# Patient Record
Sex: Female | Born: 2000 | Race: White | Hispanic: No | Marital: Single | Smoking: Never smoker
Health system: Southern US, Community
[De-identification: ages and names within clinical notes are randomized; demographics above are authoritative.]

## PROBLEM LIST (undated history)

## (undated) DIAGNOSIS — B269 Mumps without complication: Secondary | ICD-10-CM

## (undated) DIAGNOSIS — G039 Meningitis, unspecified: Secondary | ICD-10-CM

## (undated) DIAGNOSIS — R5600 Simple febrile convulsions: Secondary | ICD-10-CM

## (undated) DIAGNOSIS — R569 Unspecified convulsions: Secondary | ICD-10-CM

## (undated) DIAGNOSIS — B019 Varicella without complication: Secondary | ICD-10-CM

## (undated) DIAGNOSIS — B059 Measles without complication: Secondary | ICD-10-CM

## (undated) DIAGNOSIS — J189 Pneumonia, unspecified organism: Secondary | ICD-10-CM

## (undated) HISTORY — PX: TONSILLECTOMY: SUR1361

---
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
## 2018-07-16 ENCOUNTER — Emergency Department (HOSPITAL_COMMUNITY)
Admission: EM | Admit: 2018-07-16 | Discharge: 2018-07-16 | Disposition: A | Discharge status: Home / Self Care | Payer: BC Managed Care – PPO | Attending: Emergency Medicine | Admitting: Emergency Medicine

## 2018-07-16 ENCOUNTER — Encounter (HOSPITAL_COMMUNITY): Payer: Self-pay | Admitting: Emergency Medicine

## 2018-07-16 ENCOUNTER — Other Ambulatory Visit: Payer: Self-pay

## 2018-07-16 ENCOUNTER — Emergency Department (HOSPITAL_COMMUNITY): Payer: BC Managed Care – PPO

## 2018-07-16 DIAGNOSIS — Z20828 Contact with and (suspected) exposure to other viral communicable diseases: Secondary | ICD-10-CM | POA: Diagnosis not present

## 2018-07-16 DIAGNOSIS — R569 Unspecified convulsions: Secondary | ICD-10-CM | POA: Insufficient documentation

## 2018-07-16 DIAGNOSIS — J020 Streptococcal pharyngitis: Secondary | ICD-10-CM | POA: Diagnosis not present

## 2018-07-16 DIAGNOSIS — R000 Tachycardia, unspecified: Secondary | ICD-10-CM | POA: Insufficient documentation

## 2018-07-16 DIAGNOSIS — R509 Fever, unspecified: Secondary | ICD-10-CM | POA: Diagnosis present

## 2018-07-16 LAB — COMPREHENSIVE METABOLIC PANEL
ALT: 13 U/L (ref 0–44)
AST: 16 U/L (ref 15–41)
Albumin: 3.9 g/dL (ref 3.5–5.0)
Alkaline Phosphatase: 53 U/L (ref 38–126)
Anion gap: 11 (ref 5–15)
BUN: 7 mg/dL (ref 6–20)
CO2: 23 mmol/L (ref 22–32)
Calcium: 8.8 mg/dL — ABNORMAL LOW (ref 8.9–10.3)
Chloride: 104 mmol/L (ref 98–111)
Creatinine, Ser: 0.58 mg/dL (ref 0.44–1.00)
GFR calc Af Amer: >60 mL/min (ref >60)
GFR calc non Af Amer: >60 mL/min (ref >60)
Glucose, Bld: 110 mg/dL — ABNORMAL HIGH (ref 70–99)
Potassium: 3.6 mmol/L (ref 3.5–5.1)
Sodium: 138 mmol/L (ref 135–145)
Total Bilirubin: 0.9 mg/dL (ref 0.3–1.2)
Total Protein: 7.2 g/dL (ref 6.5–8.1)

## 2018-07-16 LAB — CBC WITH DIFFERENTIAL/PLATELET
Abs Immature Granulocytes: 0.14 10*3/uL — ABNORMAL HIGH (ref 0.00–0.07)
Basophils Absolute: 0.1 10*3/uL (ref 0.0–0.1)
Basophils Relative: 0 %
Eosinophils Absolute: 0 10*3/uL (ref 0.0–0.5)
Eosinophils Relative: 0 %
HCT: 40.1 % (ref 36.0–46.0)
Hemoglobin: 13.4 g/dL (ref 12.0–15.0)
Immature Granulocytes: 1 %
Lymphocytes Relative: 2 %
Lymphs Abs: 0.5 10*3/uL — ABNORMAL LOW (ref 0.7–4.0)
MCH: 32.1 pg (ref 26.0–34.0)
MCHC: 33.4 g/dL (ref 30.0–36.0)
MCV: 95.9 fL (ref 80.0–100.0)
Monocytes Absolute: 1 10*3/uL (ref 0.1–1.0)
Monocytes Relative: 5 %
Neutro Abs: 17.2 10*3/uL — ABNORMAL HIGH (ref 1.7–7.7)
Neutrophils Relative %: 92 %
Platelets: 184 10*3/uL (ref 150–400)
RBC: 4.18 MIL/uL (ref 3.87–5.11)
RDW: 11.8 % (ref 11.5–15.5)
WBC: 18.9 10*3/uL — ABNORMAL HIGH (ref 4.0–10.5)
nRBC: 0 % (ref 0.0–0.2)

## 2018-07-16 LAB — HCG, QUANTITATIVE, PREGNANCY: hCG, Beta Chain, Quant, S: <1 m[IU]/mL (ref <5)

## 2018-07-16 LAB — LACTIC ACID, PLASMA
Lactic Acid, Venous: 1 mmol/L (ref 0.5–1.9)
Lactic Acid, Venous: 0.8 mmol/L (ref 0.5–1.9)

## 2018-07-16 LAB — SARS CORONAVIRUS 2 BY RT PCR (HOSPITAL ORDER, PERFORMED IN ~~LOC~~ HOSPITAL LAB): SARS Coronavirus 2: NEGATIVE

## 2018-07-16 LAB — I-STAT BETA HCG BLOOD, ED (MC, WL, AP ONLY): I-stat hCG, quantitative: 23.7 m[IU]/mL — ABNORMAL HIGH (ref <5)

## 2018-07-16 LAB — GROUP A STREP BY PCR: Group A Strep by PCR: DETECTED — AB

## 2018-07-16 MED ORDER — KETOROLAC TROMETHAMINE 30 MG/ML IJ SOLN: 30.0000 mg | Freq: Once | INTRAMUSCULAR | Status: DC

## 2018-07-16 MED ORDER — DEXAMETHASONE SODIUM PHOSPHATE 10 MG/ML IJ SOLN
10.0000 mg | Freq: Once | INTRAMUSCULAR | Status: AC
  Administered 2018-07-16: 10 mg via INTRAVENOUS
  Filled 2018-07-16: qty 1

## 2018-07-16 MED ORDER — AMOXICILLIN-POT CLAVULANATE 875-125 MG PO TABS: 1.0000 | ORAL_TABLET | Freq: Two times a day (BID) | ORAL | 0 refills | Status: AC

## 2018-07-16 MED ORDER — SODIUM CHLORIDE 0.9 % IV BOLUS
1000.0000 mL | Freq: Once | INTRAVENOUS | Status: AC
  Administered 2018-07-16: 1000 mL via INTRAVENOUS

## 2018-07-16 MED ORDER — ACETAMINOPHEN 500 MG PO TABS
1000.0000 mg | ORAL_TABLET | Freq: Once | ORAL | Status: AC
  Administered 2018-07-16: 1000 mg via ORAL
  Filled 2018-07-16: qty 2

## 2018-07-16 MED ORDER — KETOROLAC TROMETHAMINE 30 MG/ML IJ SOLN
30.0000 mg | Freq: Once | INTRAMUSCULAR | Status: AC
  Administered 2018-07-16: 30 mg via INTRAVENOUS
  Filled 2018-07-16: qty 1

## 2018-07-16 MED ORDER — AMOXICILLIN-POT CLAVULANATE 875-125 MG PO TABS
1.0000 | ORAL_TABLET | Freq: Once | ORAL | Status: AC
  Administered 2018-07-16: 1 via ORAL
  Filled 2018-07-16: qty 1

## 2018-07-16 MED ORDER — DEXAMETHASONE SODIUM PHOSPHATE 10 MG/ML IJ SOLN: 10.0000 mg | Freq: Once | INTRAMUSCULAR | Status: DC

## 2018-07-16 NOTE — ED Triage Notes (Signed)
Pt c/o of sore throat and fever x 3 days.  Took 800 mg of ibuprofen this morning

## 2018-07-16 NOTE — Discharge Instructions (Signed)
Please read and follow all provided instructions.  Your diagnoses today include:  1. Strep pharyngitis   2. Tachycardia     Tests performed today include: Strep test: was POSITIVE for strep throat Vital signs. See below for your results today.   Please take all of your antibiotics until finished.   You may develop abdominal discomfort or nausea from the antibiotic. If this occurs, you may take it with food. Some patients also get diarrhea with antibiotics. You may help offset this with probiotics which you can buy or get in yogurt. Do not eat or take the probiotics until 2 hours after your antibiotic. Some women develop vaginal yeast infections after antibiotics. If you develop unusual vaginal discharge after being on this medication, please see your primary care provider.   Some people develop allergies to antibiotics. Symptoms of antibiotic allergy can be mild and include a flat rash and itching. They can also be more serious and include:  ?Hives - Hives are raised, red patches of skin that are usually very itchy.  ?Lip or tongue swelling  ?Trouble swallowing or breathing  ?Blistering of the skin or mouth.  If you have any of these serious symptoms, please seek emergency medical care immediately.  You are prescribed ibuprofen, a non-steroidal anti-inflammatory agent (NSAID) for pain. You may take 600 mg every 6 hours as needed for pain. If still requiring this medication around the clock for acute pain after 10 days, please see your primary healthcare provider.  Women who are pregnant, breastfeeding, or planning on becoming pregnant should not take non-steroidal anti-inflammatories such as Advil and Aleve. Tylenol is a safe over the counter pain reliever in pregnant women.  You may combine this medication with Tylenol, 650 mg every 6 hours, so you are receiving something for pain every 3 hours.  This is not a long-term medication unless under the care and direction of your primary  provider. Taking this medication long-term and not under the supervision of a healthcare provider could increase the risk of stomach ulcers, kidney problems, and cardiovascular problems such as high blood pressure.    Take any medications prescribed only as directed.   Home care instructions:  Please read the educational materials provided and follow any instructions contained in this packet.  You may perform saltwater gargles.   Follow-up instructions: Please follow-up with your primary care provider as needed for further evaluation of your symptoms.  Return instructions:  Please return to the Emergency Department if you experience worsening symptoms.  Return if you have worsening problems swallowing, your neck becomes swollen, you cannot swallow your saliva or your voice becomes muffled.  Return with high persistent fever, persistent vomiting, or if you have trouble breathing.  Please return if you have any other emergent concerns.  Additional Information:  Your vital signs today were: BP 104/66    Pulse (!) 103    Temp (!) 102.4 F (39.1 C) (Oral)    Resp 17    Ht 5\' 9"  (1.753 m)    Wt 65.8 kg    LMP 07/16/2018    SpO2 97%    BMI 21.41 kg/m  If your blood pressure (BP) was elevated above 135/85 this visit, please have this repeated by your doctor within one month.

## 2018-07-16 NOTE — ED Provider Notes (Signed)
[facility] Provider [facility]   CSN: [id number] Arrival date & time: 07/16/18  1044     History   Chief Complaint Chief Complaint  Patient presents with  . Fever  . Sore Throat    HPI April Glenn is a 18 y.o. female.     HPI  Patient is a 18 year old female with past medical history of recurrent ear infections, seizures, strep pharyngitis after tonsillectomy presenting for sore throat, fever, and left earache.  Patient reports that all of the symptoms began over the past 24 hours.  [place] going to [place].  Patient reports that it hurts to swallow but she does not have any obstructive swallowing or difficulty breathing.  She denies cough or shortness of breath.  Patient reports that she has had some increased pain and muffled hearing out of the left ear but no drainage.  She denies any chest pain, abdominal pain, nausea, vomiting, dysuria, urgency, frequency, or flank pain.  She took ibuprofen this morning for her symptoms without full relief.  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  Past Medical History:  Diagnosis Date  . Chicken pox   . Febrile seizures (HCC)   . Measles   . Meningitis spinal   . Mumps   . PNA (pneumonia)   . Seizures (HCC)     There are no active problems to display for this patient.   Past Surgical History:  Procedure Laterality Date  . TONSILLECTOMY       OB History   No obstetric history on file.      Home Medications    Prior to Admission medications   Medication Sig Start Date End Date Taking? Authorizing Provider  ibuprofen (ADVIL) 200 MG tablet Take 800 mg by mouth every 6 (six) hours as needed for fever or moderate pain.   Yes [provider]    Family History No family history on file.  Social History Social History   Tobacco Use  . Smoking status:  Never Smoker  . Smokeless tobacco: Never Used  Substance Use Topics  . Alcohol use: Yes    Comment: occ  . Drug use: No     Allergies   Patient has no known allergies.   Review of Systems Review of Systems  Constitutional: Positive for chills and fever.  HENT: Positive for sore throat. Negative for congestion, rhinorrhea, sinus pain, trouble swallowing and voice change.   Eyes: Negative for visual disturbance.  Respiratory: Negative for cough and shortness of breath.   Cardiovascular: Negative for chest pain.  Gastrointestinal: Negative for abdominal pain, nausea and vomiting.  Genitourinary: Negative for dysuria and flank pain.  Musculoskeletal: Negative for back pain and myalgias.  Skin: Negative for rash.  Neurological: Negative for dizziness, syncope, light-headedness and headaches.     Physical Exam Updated Vital Signs BP 104/66   Pulse (!) 103   Temp (!) 102.4 F (39.1 C) (Oral)   Resp 17   Ht 5\' 9"  (1.753 m)   Wt 65.8 kg   LMP 07/16/2018   SpO2 97%   BMI 21.41 kg/m   Physical Exam Vitals signs and nursing note reviewed.  Constitutional:      General: She is not in acute distress.    Appearance: She is well-developed.  HENT:     Head: Normocephalic and atraumatic.  Right Ear: Tympanic membrane normal.     Ears:     Comments: Left tympanic membrane with extensive scarring.  Precludes full visualization of behind the TM, but appears to have effusion.  No tenderness of mastoid or with palpation of the auricle, pinna, or tragus.    Mouth/Throat:     Mouth: Mucous membranes are moist.     Pharynx: Uvula midline. Oropharyngeal exudate and posterior oropharyngeal erythema present. No uvula swelling.     Tonsils: 0 on the right. 0 on the left.     Comments: Tonsils surgically absent.  No asymmetric swelling of the posterior pharynx.  No edema of posterior pharynx.  Eyes:     Conjunctiva/sclera: Conjunctivae normal.     Pupils: Pupils are equal, round, and  reactive to light.  Neck:     Musculoskeletal: Normal range of motion and neck supple.  Cardiovascular:     Rate and Rhythm: Regular rhythm. Tachycardia present.     Heart sounds: S1 normal and S2 normal. No murmur.  Pulmonary:     Effort: Pulmonary effort is normal.     Breath sounds: Normal breath sounds. No wheezing or rales.  Abdominal:     General: There is no distension.     Palpations: Abdomen is soft.     Tenderness: There is no abdominal tenderness. There is no guarding.  Musculoskeletal: Normal range of motion.        General: No deformity.  Lymphadenopathy:     Cervical: No cervical adenopathy.  Skin:    General: Skin is warm and dry.     Findings: No erythema or rash.  Neurological:     Mental Status: She is alert.     Comments: Cranial nerves grossly intact. Patient moves extremities symmetrically and with good coordination.  Psychiatric:        Behavior: Behavior normal.        Thought Content: Thought content normal.        Judgment: Judgment normal.      ED Treatments / Results  Labs (all labs ordered are listed, but only abnormal results are displayed) Labs Reviewed  GROUP A STREP BY PCR - Abnormal; Notable for the following components:      Result Value   Group A Strep by PCR DETECTED (*)    All other components within normal limits  CBC WITH DIFFERENTIAL/PLATELET - Abnormal; Notable for the following components:   WBC 18.9 (*)    Neutro Abs 17.2 (*)    Lymphs Abs 0.5 (*)    Abs Immature Granulocytes 0.14 (*)    All other components within normal limits  COMPREHENSIVE METABOLIC PANEL - Abnormal; Notable for the following components:   Glucose, Bld 110 (*)    Calcium 8.8 (*)    All other components within normal limits  I-STAT BETA HCG BLOOD, ED ([facility], [facility], [facility] ONLY) - Abnormal; Notable for the following components:   I-stat hCG, quantitative 23.7 (*)    All other components within normal limits  SARS CORONAVIRUS 2 (HOSPITAL ORDER, PERFORMED IN [facility])  LACTIC ACID, PLASMA  LACTIC ACID, PLASMA  HCG, QUANTITATIVE, PREGNANCY    EKG    Radiology Dg Chest Portable 1 View  Result Date: 07/16/2018 CLINICAL DATA:  18 year old female with sore throat and fever for 3 days. Cough. COVID-19 test pending. EXAM: PORTABLE CHEST 1 VIEW COMPARISON:  10/18/2014 FINDINGS: Portable AP upright view at 1226 hours. Normal lung volumes and mediastinal contours. Visualized tracheal air column  is within normal limits. Allowing for portable technique the lungs are clear. No pneumothorax or pleural effusion. No osseous abnormality identified. IMPRESSION: Negative portable chest. Electronically Signed   By: Odessa FlemingH  Hall M.D.   On: 07/16/2018 12:56    Procedures Procedures (including critical care time)  Medications Ordered in ED Medications  amoxicillin-clavulanate (AUGMENTIN) 875-125 MG per tablet 1 tablet (has no administration in time range)  acetaminophen (TYLENOL) tablet 1,000 mg (1,000 mg Oral Given 07/16/18 1140)  sodium chloride 0.9 % bolus 1,000 mL (0 mLs Intravenous Stopped 07/16/18 1450)     Initial Impression / Assessment and Plan / ED Course  I have reviewed the triage vital signs and the nursing notes.  Pertinent labs & imaging results that were available during my care of the patient were reviewed by me and considered in my medical decision making (see chart for details).  Clinical Course as of Jul 16 1631  Wed Jul 16, 2018  1319 WBC(!): 18.9 [AM]  1319 Lactic Acid, Venous: 1.0 [AM]  1433 Group A Strep by PCR(!): DETECTED [AM]  1433 SARS Coronavirus 2: NEGATIVE [AM]  1516 Significantly improved.   Pulse Rate(!): 106 [AM]  1557 Pt reports that there is no change   I-stat hCG, quantitative(!): 23.7 [AM]  1628 Istat hcg false positive.   HCG, Beta Chain, Quant, S: <1 [AM]    Clinical Course User Index [AM] Aviva KluverMurray, Kinzy Weyers B, PA-C       This is a previously healthy 18 year old female with past medical history of  recurrent ear infections and strep pharyngitis presenting for sore throat, left otalgia, fever and chills.  On arrival she was initially tachycardic to 138 with a fever of 102.4.  She remained nontoxic-appearing, and had a patent airway without evidence of RPA, PTA, or other deep space infection of the head or neck.  Work-up demonstrating leukocytosis of 18.9.  CMP unremarkable.  SARS-CoV-2 test negative.  No lactic acidosis.  Group A strep was detected.  Chest x-ray without cardiopulmonary abnormality.  Incidentally, patient had a elevated hCG rapid test.  This was followed with a formal quantitative test which was negative.  Patient was administered Decadron and Toradol.  She will be treated with Augmentin for strep pharyngitis and ear infection.  Patient was instructed to follow-up with her primary care provider for recheck.  On review of records, every time patient is evaluated she has tachycardia.  Her tachycardia significantly improved with fluids and antipyretics.  She was encouraged to continue to use antipyretics.  She was also encouraged to follow-up with primary care provider for further work-up if she continues to have tachycardia at rest.  Given the chronicity of this, low suspicion for sepsis, DVT/PE, or other cause of tachycardia.  Did consider hyperthyroidism, however patient does not appear to be thyrotoxic today.  Return precautions given for any worsening sore throat, difficulty breathing or difficulty swallowing, neck induration or submandibular tenderness.  Patient is in understanding and agrees with plan of care.  This is a shared visit with Dr. Darrick GrinderJoshua Zavit. Patient was independently evaluated by this attending physician. Attending physician consulted in evaluation and discharge management.  Final Clinical Impressions(s) / ED Diagnoses   Final diagnoses:  Strep pharyngitis  Tachycardia    ED Discharge Orders         Ordered    amoxicillin-clavulanate (AUGMENTIN) 875-125 MG  tablet  Every 12 hours     07/16/18 1646           [place]ceolaMurray, Saginaw[place]lyssa B,  PA-C 07/16/18 1646    Elnora Morrison, MD 07/17/18 [phone]

## 2018-07-16 NOTE — ED Notes (Signed)
Pt given sprite 

## 2019-02-11 ENCOUNTER — Ambulatory Visit: Admit: 2019-02-11 | Discharge: 2019-02-11 | Payer: PRIVATE HEALTH INSURANCE | Attending: Internal Medicine

## 2019-02-11 DIAGNOSIS — R000 Tachycardia, unspecified: Secondary | ICD-10-CM

## 2019-02-11 NOTE — Progress Notes (Signed)
[place]   Electrophysiology Consultation   Date: 02/11/2019  Reason for Consultation: Syncope/tachycardia  Consult Requesting Physician: No primary care provider on file.      Chief Complaint   Patient presents with   ??? New Patient   ??? Tachycardia        HPI: [name] is a 19 y.o. presents to the office as a new patient.  She has been having episodes of chest pain that can happen without any particular triggers.  She has noted these pains to be associated with increased heart rate.  She was having fainting spells the summer of 2019 that have resolved.  She correlated her chest pain and syncopal episodes with stressors. She records her episodes with her Apple Watch. These episodes have symptoms of chest pain and presyncope. She does occasionally feel her heart racing. 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  More recently she stood up from her chair at work and felt lightheaded with some chest discomfort and tachycardia.    History reviewed. No pertinent past medical history.     History reviewed. No pertinent surgical history.    Allergies:  No Known Allergies    Social History:   reports that she has been smoking. She has never used smokeless tobacco. She reports current alcohol use.     Family History:     Reviewed. Denies family history of sudden cardiac death, arrhythmia, premature CAD    Review of System:  All other systems reviewed and are negative except for that noted above. Pertinent negatives are:   General: negative for fever, chills   Ophthalmic ROS: negative for - eye pain or loss of vision  ENT ROS: negative for - headaches, sore throat   Respiratory: negative for - cough, sputum  Cardiovascular: Reviewed in HPI  Gastrointestinal: negative for - abdominal pain, diarrhea, N/V  Hematology: negative for - bleeding, blood clots, bruising or jaundice  Genito-Urinary:  negative for - Dysuria or incontinence  Musculoskeletal: negative  for - Joint swelling, muscle pain  Neurological: negative for - confusion, dizziness, headaches   Psychiatric: No anxiety, no depression.  Dermatological: negative for - rash    Physical Examination:  Vitals:    02/11/19 1610   BP: 126/83   Pulse: 108   SpO2: 98%      Wt Readings from Last 3 Encounters:   02/11/19 155 lb 6.4 oz (70.5 kg)       ?? Constitutional: Oriented. No distress.   ?? Head: Normocephalic and atraumatic.   ?? Mouth/Throat: Oropharynx is clear and moist.   ?? Eyes: Conjunctivae normal. EOM are normal.   ?? Neck: Neck supple. No rigidity. No JVD present.    ?? Cardiovascular: Normal rate, regular rhythm, S1&S2.  SM  ?? Pulmonary/Chest: Bilateral respiratory sounds. No wheezes, No rhonchi.    ?? Abdominal: Soft. Bowel sounds present. No distension, No tenderness.   ?? Musculoskeletal: No tenderness. No edema    ?? Lymphadenopathy: Has no cervical adenopathy.   ?? Neurological: Alert and oriented. Cranial nerve appears intact, No Gross deficit   ?? Skin: Skin is warm and dry. No rash noted.   ?? Psychiatric: Has a normal behavior       Labs, diagnostic and imaging results reviewed.   Reviewed.   Lab Results   Component Value Date    CREATININE 0.6 12/21/2008    AST 24 12/21/2008    ALT 19  12/21/2008       ECG:  02/11/19  Sinus tachycardia 103    Echo: none  Cath: none    Medication:  No current outpatient medications on file.     No current facility-administered medications for this visit.        Assessment and plan:       Chest pain/Tachycardia    -ECG today shows sinus tachycardia  -Monitors rates with her apple watch.  Prior to the [employer] she had noted her heart rate to be elevated at times and that is why she got the watch.  Now she notices that her heart rate may shot up to 140s without any triggers.  -Will order a 7 day holter to more objectively assess her heart rate profile.  We will also be able to correlate her symptoms to her rhythm.  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  I doubt that the chest pain is any significant cardiac disease given the lack of any family history of CAD and her young age.    -A systolic murmur noted upon ascultation, will do an echo to r/o anatomical abnormality    I will start her on a beta-blocker after the monitor is done to help with the pain and also tachycardia.  Hydration was recommended.    Syncope and collapse  -Rarely has episodes of chest pain and tachycardia in conjunction with this  She has not had any recent episodes of syncope.  They probably were related to a vagal event.  -Labs ordered   -BMP, CBC, TSH  -Encouraged hydration and being careful when changing positions    Plan  7 day holter  Echo  Blood work  1 month with EPNP  Start metoprolol 25 mg BID after monitor     Thank you for allowing me to participate in the care of Lashone F Dimario.  Further evaluation will be based upon the patient's clinical course and testing results.     All questions and concerns were addressed to the patient/family. Alternatives to my treatment were discussed. I have discussed the above stated plan and the patient verbalized understanding and agreed with the plan.    NOTE: This report was transcribed using voice recognition software. Every effort was made to ensure accuracy, however, inadvertent computerized transcription errors may be present.       Birdie Hopes, MD, [facility]  [facility]   Office: [phone]     Scribe attestation: This note was scribed in the presence of Birdie Hopes, MD by Levora Angel, RN    I, Dr. Birdie Hopes personally performed the services described in this documentation as scribed by RN in my presence, and it is both accurate and complete. ??

## 2019-02-18 ENCOUNTER — Ambulatory Visit: Payer: PRIVATE HEALTH INSURANCE

## 2019-02-19 ENCOUNTER — Inpatient Hospital Stay: Admit: 2019-02-19 | Payer: PRIVATE HEALTH INSURANCE

## 2019-02-19 DIAGNOSIS — R079 Chest pain, unspecified: Secondary | ICD-10-CM

## 2019-02-19 LAB — ECHOCARDIOGRAM COMPLETE 2D W DOPPLER W COLOR: Left Ventricular Ejection Fraction: 68

## 2019-02-20 NOTE — Telephone Encounter (Signed)
Called pt about the message below and she verbalized understanding.

## 2019-02-20 NOTE — Telephone Encounter (Signed)
Echo results unremarkable. EF normal.    Colonel Bald, APRN-CNP

## 2019-02-20 NOTE — Telephone Encounter (Signed)
Pt calling for her echo results pls call to advise thank you

## 2019-03-17 ENCOUNTER — Ambulatory Visit: Admit: 2019-03-17 | Discharge: 2019-03-17 | Payer: PRIVATE HEALTH INSURANCE | Attending: Family

## 2019-03-17 DIAGNOSIS — R000 Tachycardia, unspecified: Secondary | ICD-10-CM

## 2019-03-17 MED ORDER — PROPRANOLOL HCL ER 60 MG PO CP24
60 MG | ORAL_CAPSULE | Freq: Every day | ORAL | 1 refills | Status: DC
Start: 2019-03-17 — End: 2019-03-17

## 2019-03-17 MED ORDER — PROPRANOLOL HCL ER 60 MG PO CP24
60 MG | ORAL_CAPSULE | Freq: Every day | ORAL | 1 refills | Status: DC
Start: 2019-03-17 — End: 2019-05-13

## 2019-03-17 NOTE — Progress Notes (Signed)
[facility]   Electrophysiology  Bettye Boeck, APRN-CNP  Attending EP: Dr. Jorene Minors  Date: 03/17/2019  I had the privilege of visiting Morgan Price in the office.     Chief Complaint:   Chief Complaint   Patient presents with   ??? Tachycardia     History of Present Illness: History obtained from patient and medical record.    Morgan Price is 19 y.o. female with no significant PMHx who presented to office as new patient 02/11/2019 with complaints of chest pain and elevated HR. She also has hx of syncope and pre-syncope.     Interval history: Today, Morgan Price is being seen for syncope and tachycardia.  She attempted to wear a monitor, however she had a bad skin reaction and she had to remove it after the 3rd day.  She did have symptoms while being recorded. Denies any syncopal ore pre-syncopal episodes.  She has seen a therapist in the past who felt her tachycardia and dizziness could be cause by these anxiety attacks.  Denies any recurrent episodes of syncope or pe-syncope, however she continues to experience chest pressure and palpitations with tachycardia up to low 100's on her apple watch.  She has not been exercising due to her symptoms. Echo was normal.     Denies having chest pain, palpitations, shortness of breath or dizziness at the time of this visit.    With regard to medication therapy the patient has been compliant with prescribed regimen. She has tolerated therapy to date.     Assessment:  Tachycardia   -  ECG shows SR 75 today   - Reports episodes of symptomatic tachycardia daily with chest pressure, palpitaions and SOB   - She has stopped working out due to symptoms   - Could be at least worsened by [employer]   - She wore monitor for 3 days due to skin reaction that is now resolved.  She brings monitor in today, no results are avail;able for review  Anxiety   - Could be source of tachycardia and symptoms   - She has worked with therapist in the past who felt her symptoms could be  related to anxiety attacks   - Can try propranolol that will reduce HR and can possibly treat anxiety   - Also follow with PCP, which was discussed  Syncope   - Rarely has episodes of syncope with tachycardia/CP   - Likely vasovagal given presentation   - No recurrence since 2019  Plan  - Start propranolol daily   - Can switch to as needed in the future  - Call office if symptoms nor improved  - Will get monitor results and follow up if needed    F/U: Follow-up with EP in 2-3 months NPAL  -Follow up with device clinic as scheduled  -Call [facility] at [phone] with any questions    Allergies:  No Known Allergies  Home Medications:  Prior to Visit Medications    Not on File      Past Medical History:  No past medical history on file.  Past Surgical History:    has no past surgical history on file.   Social History:  Reviewed.  reports that she has been smoking. She has never used smokeless tobacco. She reports current alcohol use.   Family History:  Reviewed. family history is not on file.   Denies family history of sudden cardiac death, arrhythmia, premature CAD    Review of System:  ??  Constitutional: No weight changes or weakness  ?? HEENT: No visual changes. No mouth sores or sore throat.  ?? Cardiovascular: admits to chest pain, admits to dyspnea on exertion, admits to palpitations or denies loss of consciousness. No cough, hemoptysis, denies pleuritic pain, or phlebitis.admits to dizziness.   ?? Respiratory: denies cough or wheezing.   ?? Gastrointestinal: Negative, No blood in stools.   ?? Genitourinary: No hematuria.  ?? Neurological: No focal weakness  ?? Psychiatric: No confusion, anxiety, or depression.  ?? Hem/Lymph: Denies abnormal bruising or bleeding.    Physical Examination:  There were no vitals filed for this visit.   Wt Readings from Last 3 Encounters:   02/11/19 155 lb 6.4 oz (70.5 kg)     ??? Constitutional: Cooperative and in no apparent distress, and appears well nourished  ??? Skin: Warm and  pink; no cyanosis or bruising  ??? HEENT: Symmetric and normocephalic. Conjunctiva pink with clear sclera. Mucus membranes pink and moist. No visible masses/goiter  ??? Respiratory: Respirations symmetric and unlabored. Lungs clear to auscultation bilaterally, no wheezing, rhonchi, or crackles.  ??? Cardiovascular:  regular rate and rhythm. S1 & S2 present, negative for murmur. negative elevation of JVP. No peripheral edema.    ??? Musculoskeletal:  No focal weakness.  ??? Neurological/Psych: Awake and orientated to person, place and time. Calm affect, appropriate mood.     Pertinent labs, diagnostic, device, and imaging results reviewed as a part of this visit    LABS    CBC:   Lab Results   Component Value Date    WBC 10.9 12/21/2008    HGB 15.5 12/21/2008    HCT 46.0 (H) 12/21/2008    MCV 91.8 12/21/2008    PLT 252 12/21/2008     BMP:   Lab Results   Component Value Date    CREATININE 0.6 12/21/2008    BUN 12 12/21/2008    NA 139 12/21/2008    K 3.8 12/21/2008    CL 103 12/21/2008    CO2 27 12/21/2008     CrCl cannot be calculated (Patient's most recent lab result is older than the maximum 120 days allowed.).   No results found for: BNP    Thyroid: No results found for: TSH, T3TOTAL, T4TOTAL, THYROIDAB  Lipid Panel: No results found for: CHOL, HDL, TRIG  LFTs:  Lab Results   Component Value Date    ALT 19 12/21/2008    AST 24 12/21/2008    ALKPHOS 220 12/21/2008    BILITOT 0.50 12/21/2008     Coags: No results found for: PROTIME, INR, APTT  ECG: 03/17/2019 SR HR 75, PR 118, QRS 82, QTc 402    Echo: 02/19/2019   Summary   -Left ventricular cavity size is normal.   -Overall left ventricular systolic function appears normal.   -Ejection fraction is visually estimated to be 65-70%.   -No regional wall motion abnormalities are noted.   -Normal diastolic function. E/e' = 5.0.   -Aortic valve leaflets are not well seen, but appear tri-leaflet.   -Trivial mitral regurgitation.   -Trivial tricuspid regurgitation.   -IVC size is  normal (<2.1 cm) but collapses < 50% with respiration   consistent with elevated RA pressure (8 mmHg).    Diet & Exercise:  ??? The patient is counseled to follow a low salt diet to assure blood pressure remains controlled for cardiovascular risk factor modification  ??? The patient is counseled to avoid excess caffeine, and energy drinks as this may  exacerbated ectopy and arrhythmia  ??? The patient is counseled to lose weight to control cardiovascular risk factors  ??? Exercise program discussed: To improve overall cardiovascular health, the patient is instructed to increase cardiovascular related activities with a goal of 150 min/week of moderate level activity or 10,000 steps per day. Encouraged to perform as much activity as tolerated     I have addressed the patient's cardiac risk factors and adjusted pharmacologic treatment as needed. In addition, I have reinforced the need for patient directed risk factor modification.    I independently reviewed the holter and ECG    All questions and concerns were addressed with the patient. Alternatives to treatment were discussed.     Thank you for allowing to [place] to participate in the care of Morgan Price.    Bettye Boeck, APRN-CNP  [facility]   Office: [phone]

## 2019-03-17 NOTE — Patient Instructions (Signed)
-   Start propranolol 30 mg daily and can increase to 60 mg daily as needed for fast heart beat and palpitations  - Call office if your symptoms do not improve  - Will call with results of the monitor if they are abnormal  - Follow up in 2-3 months

## 2019-03-24 NOTE — Addendum Note (Signed)
Addended by: Onnie Graham on: 03/24/2019 08:52 AM     Modules accepted: Orders

## 2019-05-13 ENCOUNTER — Ambulatory Visit: Admit: 2019-05-13 | Discharge: 2019-05-13 | Payer: PRIVATE HEALTH INSURANCE | Attending: Family

## 2019-05-13 DIAGNOSIS — R000 Tachycardia, unspecified: Secondary | ICD-10-CM

## 2019-05-13 MED ORDER — PROPRANOLOL HCL 20 MG PO TABS
20 MG | ORAL_TABLET | Freq: Every day | ORAL | 1 refills | Status: DC | PRN
Start: 2019-05-13 — End: 2019-09-02

## 2019-05-13 MED ORDER — PROPRANOLOL HCL ER 60 MG PO CP24
60 MG | ORAL_CAPSULE | Freq: Every day | ORAL | 3 refills | Status: DC
Start: 2019-05-13 — End: 2019-09-02

## 2019-05-13 NOTE — Progress Notes (Signed)
[facility]   Electrophysiology  Dominic Pea, APRN-CNP  Attending EP: Dr. Patience Musca  Date: 05/13/2019  I had the privilege of visiting Morgan Price in the office.     Chief Complaint:   Chief Complaint   Patient presents with   ??? Follow-up     2 month     History of Present Illness: History obtained from patient and medical record.    GLORIS Price is 19 y.o. female with no significant PMHx who presented to office as new patient 02/11/2019 with complaints of chest pain and elevated HR. She also has hx of syncope and pre-syncope.     Interval history: Today, SHUNTELL FOODY is being seen for follow up.  Holter monitor results were not available at last visit due to patient bringing Holter in with her to appt.  Holter 1/20-1/25/2021 showed symptoms with SR, no arrhythmia.  She was started on propranolol at her last visit due to palpitations and anxiety. Reports that she has had signifiant improvement in her symptoms.  Symptoms have gone from a few times daily to a few times monthly  She is asking for additional as needed rx of propranolol.  At times she has taken additional 60 mg tablet with relief of breakthrough symptoms.  Denies SOB, swelling, or syncope.    Denies having chest pain, palpitations, shortness of breath, orthopnea/PND, cough, or dizziness at the time of this visit.    With regard to medication therapy the patient has been compliant with prescribed regimen. She has tolerated therapy to date.       Assessment:  Tachycardia/Palpitations   - ECG shows SR   - Palpitations have improved significantly with propranolol   - Could be exaggerated by anxiety    - She wore Holter monitor for 5 days and removed early due to skin reaction;  97 symptoms and no arrythmia noted  Anxiety   - She has noticed her symptoms worsen during episodes of anxiety   - On propranolol with good relief  Syncope   - No arrhythmia on Holter   - Likely vasovagal given presentation   - No recurrence since 2019  Plan  -  Continue propranolol daily  - Add prn dose for breakthrough   - Establish with PCP  - Call office if symptoms worsen  - Increase activity     F/U: Follow-up with [facility] and as needed  -Follow up with device clinic as scheduled  -Call [facility] at [phone] with any questions    Allergies:  No Known Allergies  Home Medications:  Prior to Visit Medications    Medication Sig Taking? Authorizing Provider   propranolol (INDERAL LA) 60 MG extended release capsule Take 1 capsule by mouth daily  Dominic Pea, APRN - CNP      Past Medical History:  No past medical history on file.  Past Surgical History:    has no past surgical history on file.   Social History:  Reviewed.  reports that she has been smoking. She has never used smokeless tobacco. She reports current alcohol use.   Family History:  Reviewed. family history is not on file.   Denies family history of sudden cardiac death, arrhythmia, premature CAD    Review of System:  ?? Constitutional: No weight changes or weakness  ?? HEENT: No visual changes. No mouth sores or sore throat.  ?? Cardiovascular: denies exertional chest pain, denies dyspnea on exertion, admits to palpitations or denies loss of consciousness.  No cough, hemoptysis, denies pleuritic pain, or phlebitis.denies dizziness.   ?? Respiratory: denies cough or wheezing.   ?? Gastrointestinal: Negative, No blood in stools.   ?? Genitourinary: No hematuria.  ?? Neurological: No focal weakness  ?? Psychiatric: No confusion, anxiety, or depression.  ?? Hem/Lymph: Denies abnormal bruising or bleeding.    Physical Examination:  There were no vitals filed for this visit.   Wt Readings from Last 3 Encounters:   03/17/19 156 lb 12.8 oz (71.1 kg)   02/11/19 155 lb 6.4 oz (70.5 kg)     ??? Constitutional: Cooperative and in no apparent distress, and appears well nourished  ??? Skin: Warm and pink; no cyanosis or bruising  ??? HEENT: Symmetric and normocephalic. Conjunctiva pink with clear sclera. Mucus membranes  pink and moist. No visible masses/goiter  ??? Respiratory: Respirations symmetric and unlabored. Lungs clear to auscultation bilaterally, no wheezing, rhonchi, or crackles.  ??? Cardiovascular:  regular rate and rhythm. S1 & S2 present, negative for murmur. negative elevation of JVP. No peripheral edema.    ??? Musculoskeletal:  No focal weakness.  ??? Neurological/Psych: Awake and orientated to person, place and time. Calm affect, appropriate mood.     Pertinent labs, diagnostic, device, and imaging results reviewed as a part of this visit     LABS    CBC:   Lab Results   Component Value Date    WBC 10.9 12/21/2008    HGB 15.5 12/21/2008    HCT 46.0 (H) 12/21/2008    MCV 91.8 12/21/2008    PLT 252 12/21/2008     BMP:   Lab Results   Component Value Date    CREATININE 0.6 12/21/2008    BUN 12 12/21/2008    NA 139 12/21/2008    K 3.8 12/21/2008    CL 103 12/21/2008    CO2 27 12/21/2008     CrCl cannot be calculated (Patient's most recent lab result is older than the maximum 120 days allowed.).   No results found for: BNP    Thyroid: No results found for: TSH, T3TOTAL, T4TOTAL, THYROIDAB  Lipid Panel: No results found for: CHOL, HDL, TRIG  LFTs:  Lab Results   Component Value Date    ALT 19 12/21/2008    AST 24 12/21/2008    ALKPHOS 220 12/21/2008    BILITOT 0.50 12/21/2008     Coags: No results found for: PROTIME, INR, APTT    ECG: 05/12/2019 SR HR 66, PR 118, QRS 82, QTc 384     Echo: 02/19/2019   Summary   -Left ventricular cavity size is normal.   -Overall left ventricular systolic function appears normal.   -Ejection fraction is visually estimated to be 65-70%.   -No regional wall motion abnormalities are noted.   -Normal diastolic function. E/e' = 5.0.   -Aortic valve leaflets are not well seen, but appear tri-leaflet.   -Trivial mitral regurgitation.   -Trivial tricuspid regurgitation.   -IVC size is normal (<2.1 cm) but collapses < 50% with respiration   consistent with elevated RA pressure (8 mmHg).    Diet &  Exercise:  ??? The patient is counseled to follow a low salt diet to assure blood pressure remains controlled for cardiovascular risk factor modification  ??? The patient is counseled to avoid excess caffeine, and energy drinks as this may exacerbated ectopy and arrhythmia  ??? The patient is counseled to lose weight to control cardiovascular risk factors  ??? Exercise program discussed: To improve overall  cardiovascular health, the patient is instructed to increase cardiovascular related activities with a goal of 150 min/week of moderate level activity or 10,000 steps per day. Encouraged to perform as much activity as tolerated     I have addressed the patient's cardiac risk factors and adjusted pharmacologic treatment as needed. In addition, I have reinforced the need for patient directed risk factor modification.    I independently reviewed the holter and ECG    All questions and concerns were addressed with the patient. Alternatives to treatment were discussed.     Thank you for allowing to [place] to participate in the care of Taina F Haubner.    Bettye Boeck, APRN-CNP  [facility]   Office: [phone]

## 2019-05-13 NOTE — Patient Instructions (Signed)
-   Continue propranolol 60 mg daily  - Additional 10 mg daily can be used for breakthrough symptoms  - Establish with PCP  - Call office is symptoms recurrent   - Follow up in 1 year and as needed

## 2019-09-02 NOTE — Telephone Encounter (Signed)
Her Holter monitor did not show any significant arrhythmia.  I do not recommend medical therapy at this time.  Recommend hydration (LOTS of fluids) and compression stockings.  Should her symptoms worsen we can consider repeat Holter monitoring to see if any arrhythmia. Also other medicaitons can be considered later in pregnancy if needed. Discussed with Dr. Jorene Minors who agrees.     Bettye Boeck, APRN-CNP

## 2019-09-02 NOTE — Addendum Note (Signed)
Addended byBettye Boeck on: 09/02/2019 04:04 PM     Modules accepted: Orders

## 2019-09-02 NOTE — Telephone Encounter (Signed)
Last OV 05/13/19 AL, CNP

## 2019-09-02 NOTE — Telephone Encounter (Signed)
She found out she is pregnant (10 weeks) . Her ob/gyn told her she shouldn't take propranolol while pregnant so she took it a few times a week and then stopped taking it all together the last 6 weeks. Her heart is racing and she is getting her dizzy spells again a few times per week. Her ob/gyn told her to call [facility] to see if there is another med she should be taking or if he wanted to see her .

## 2019-09-02 NOTE — Telephone Encounter (Signed)
Called patient and LMOM to call office back to relay NPAL message below.

## 2019-09-02 NOTE — Telephone Encounter (Signed)
Bettye Boeck, APRN - CNP     AL   09/02/19 4:04 PM  Note     Her Holter monitor did not show any significant arrhythmia.  I do not recommend medical therapy at this time.  Recommend hydration (LOTS of fluids) and compression stockings.  Should her symptoms worsen we can consider repeat Holter monitoring to see if any arrhythmia. Also other medicaitons can be considered later in pregnancy if needed. Discussed with Dr. Jorene Minors who agrees.     Bettye Boeck, APRN-CNP          Patient returned call and was informed of message below.

## 2019-09-08 NOTE — Telephone Encounter (Signed)
LMOM for patient to return call in regards to message below

## 2019-09-14 NOTE — Telephone Encounter (Signed)
I spoke with pt and relayed message per NPAL. She verbalized understanding.

## 2021-01-19 IMAGING — CR PORTABLE CHEST - 1 VIEW
1 series · 2 of 2 positions shown · non-contrast
Comparison: 10/18/2014

CLINICAL DATA: 18-year-old female with sore throat and fever for 3
days. Cough. OB2DS-H9 test pending.

EXAM:
PORTABLE CHEST 1 VIEW

[Series 1: portable · 0.17mm/px · 2 of 2 slices shown]
[im 1/2]
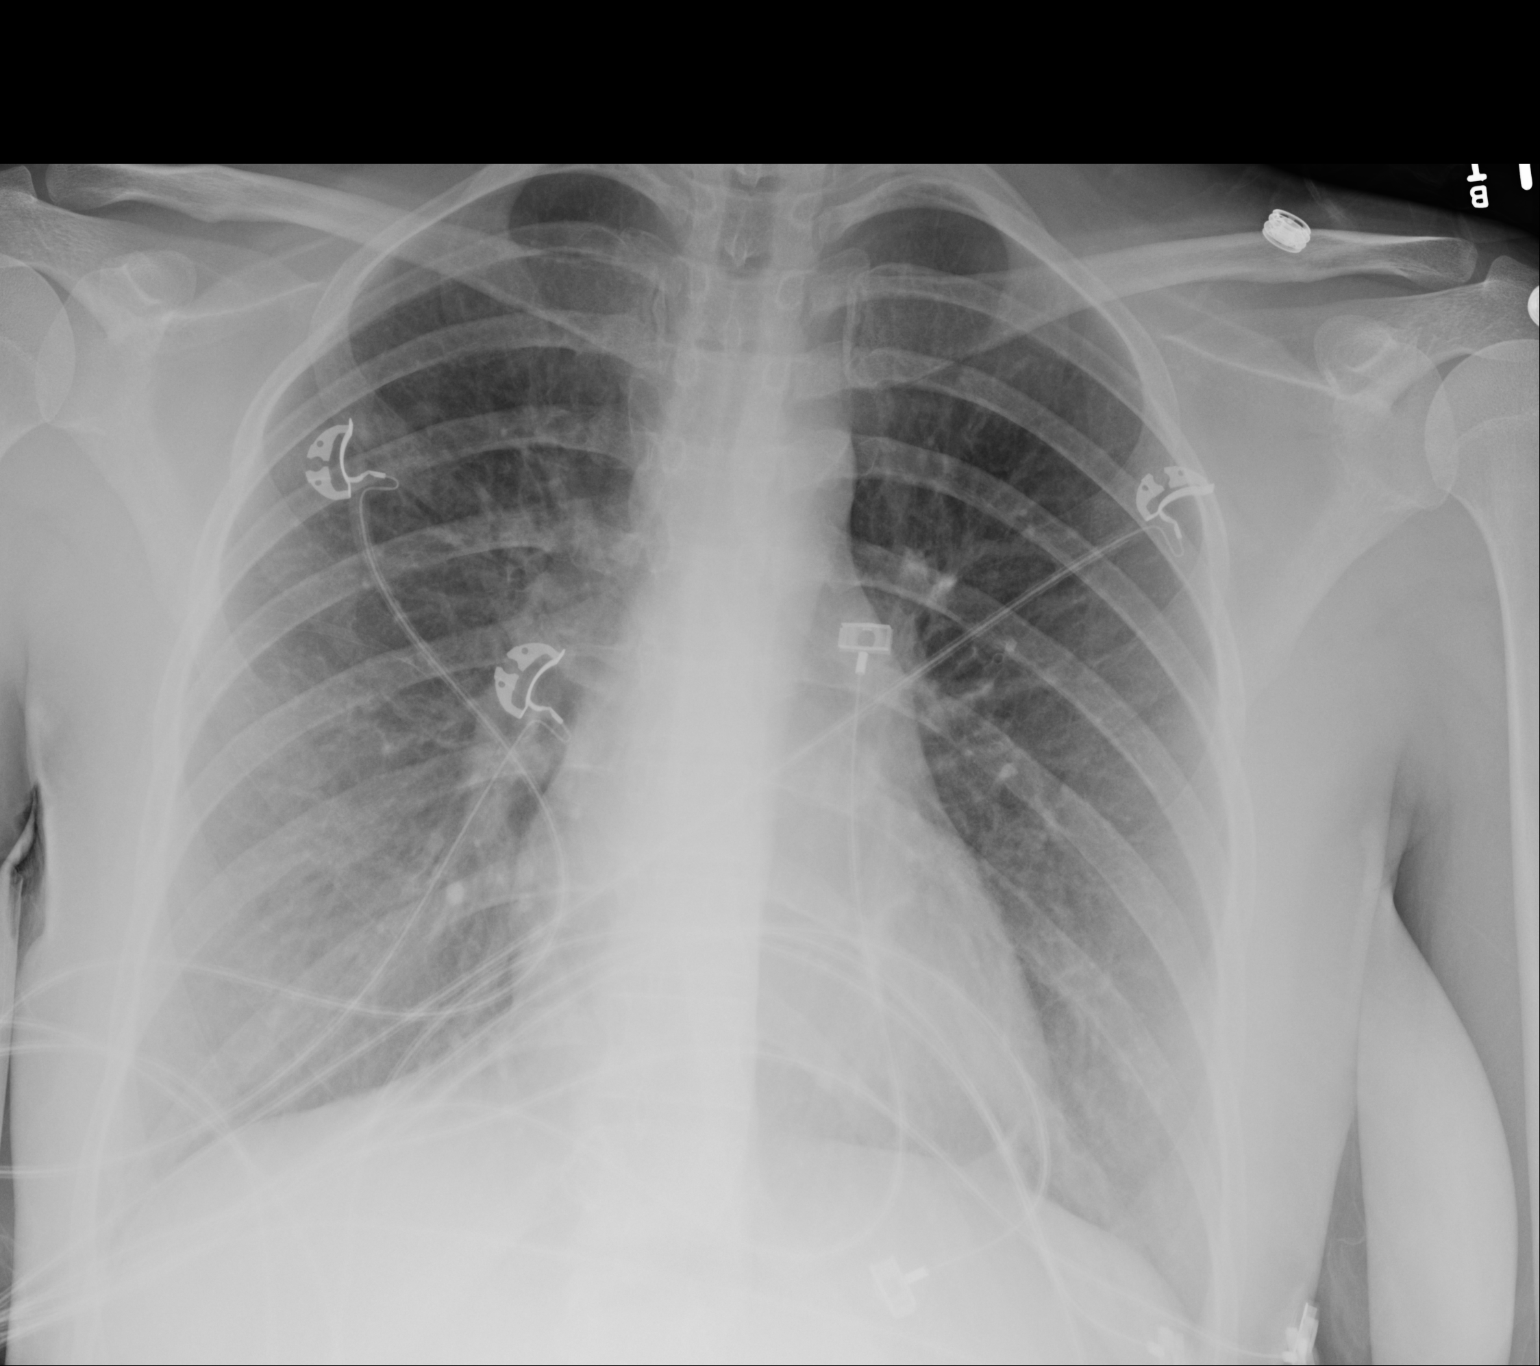
[im 2/2]
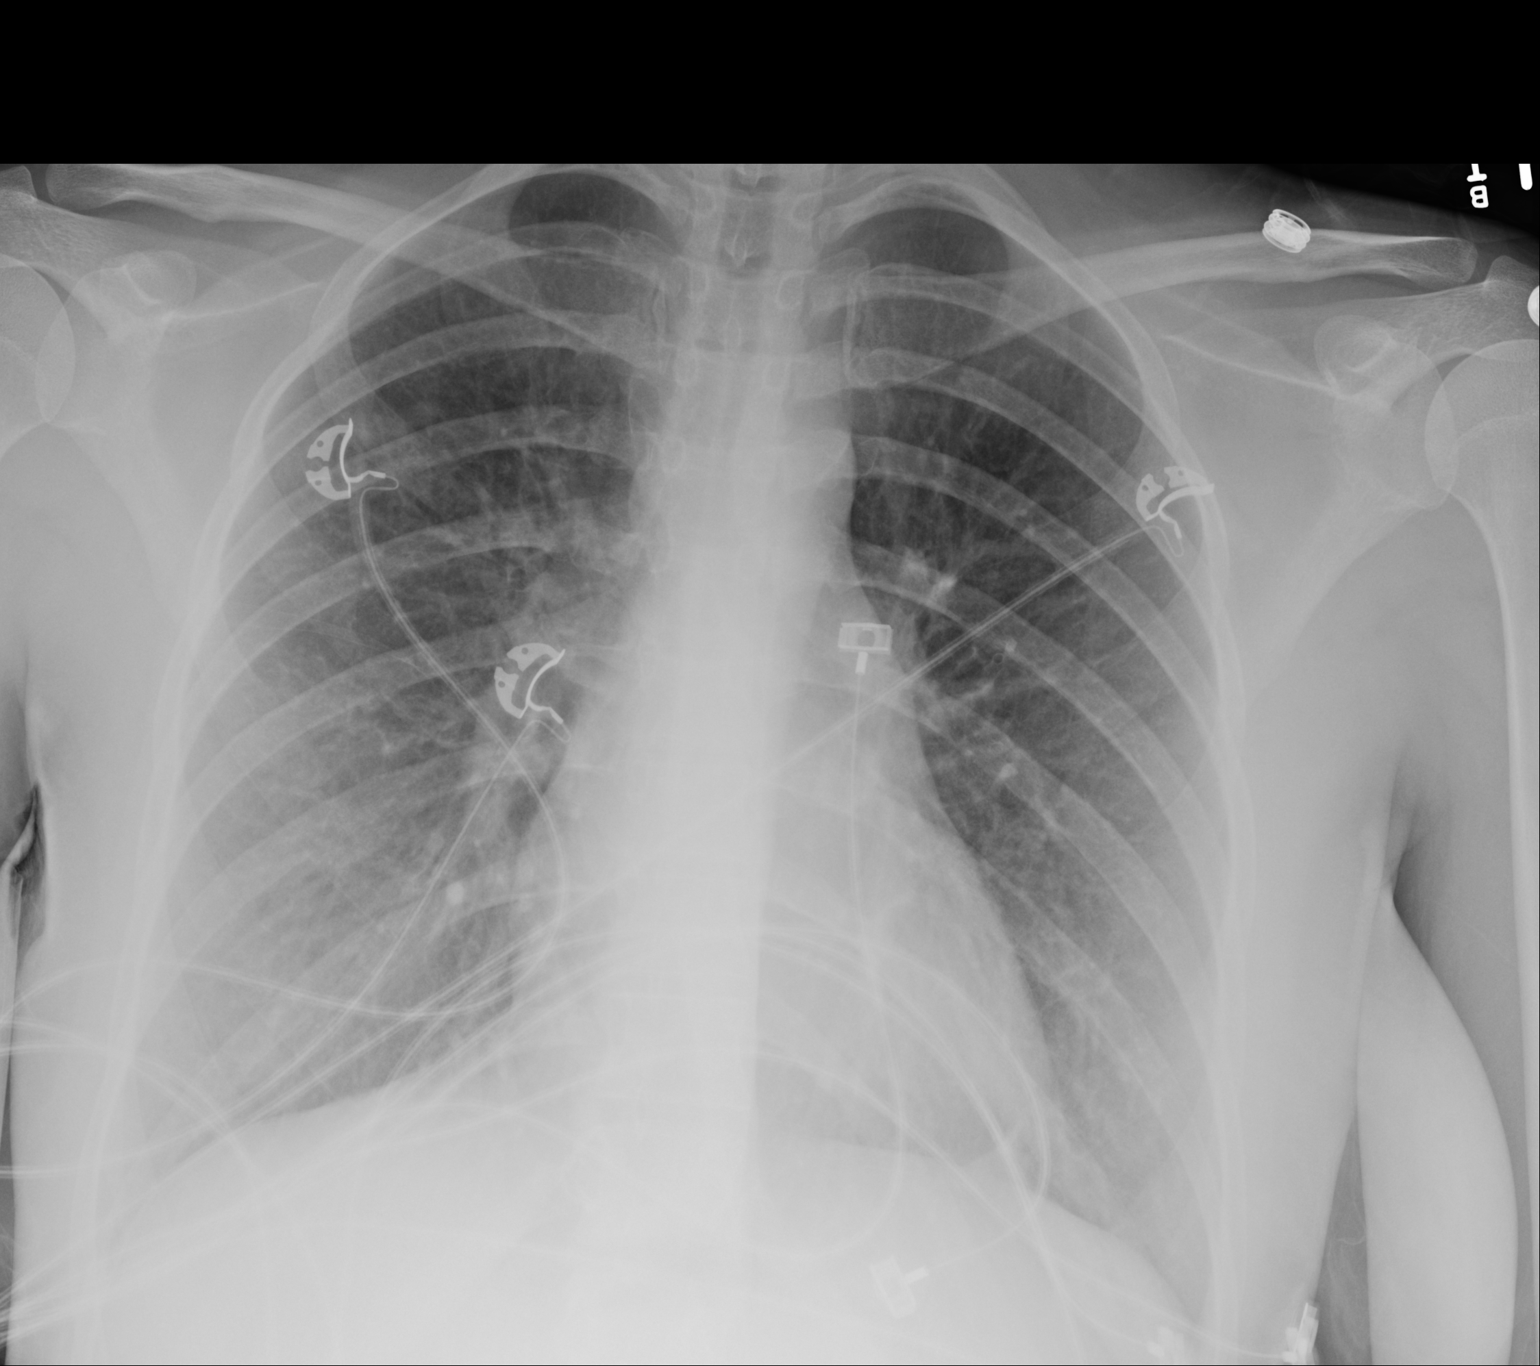

[2 of 2 positions shown; findings below may reference images not displayed]

FINDINGS: Portable AP upright view at 6111 hours. Normal lung volumes and
mediastinal contours. Visualized tracheal air column is within
normal limits. Allowing for portable technique the lungs are clear.
No pneumothorax or pleural effusion. No osseous abnormality
identified.
IMPRESSION: Negative portable chest.
